# Patient Record
Sex: Female | Born: 1965 | Race: White | Hispanic: No
Health system: Southern US, Community
[De-identification: ages and names within clinical notes are randomized; demographics above are authoritative.]

---

## 2012-03-18 HISTORY — PX: BREAST BIOPSY: SHX20

## 2017-02-26 DIAGNOSIS — Z0000 Encounter for general adult medical examination without abnormal findings: Secondary | ICD-10-CM | POA: Diagnosis not present

## 2017-05-14 DIAGNOSIS — R739 Hyperglycemia, unspecified: Secondary | ICD-10-CM | POA: Diagnosis not present

## 2017-05-14 DIAGNOSIS — Z0000 Encounter for general adult medical examination without abnormal findings: Secondary | ICD-10-CM | POA: Diagnosis not present

## 2017-05-14 DIAGNOSIS — Z23 Encounter for immunization: Secondary | ICD-10-CM | POA: Diagnosis not present

## 2017-09-17 DIAGNOSIS — R1013 Epigastric pain: Secondary | ICD-10-CM | POA: Diagnosis not present

## 2017-09-29 DIAGNOSIS — N390 Urinary tract infection, site not specified: Secondary | ICD-10-CM | POA: Diagnosis not present

## 2017-10-18 DIAGNOSIS — A048 Other specified bacterial intestinal infections: Secondary | ICD-10-CM | POA: Diagnosis not present

## 2017-11-11 DIAGNOSIS — R739 Hyperglycemia, unspecified: Secondary | ICD-10-CM | POA: Diagnosis not present

## 2018-02-15 DIAGNOSIS — Z23 Encounter for immunization: Secondary | ICD-10-CM | POA: Diagnosis not present

## 2018-02-16 ENCOUNTER — Other Ambulatory Visit: Payer: Self-pay | Admitting: Family Medicine

## 2018-02-16 DIAGNOSIS — N644 Mastodynia: Secondary | ICD-10-CM

## 2018-02-16 DIAGNOSIS — Z1239 Encounter for other screening for malignant neoplasm of breast: Secondary | ICD-10-CM

## 2018-02-25 ENCOUNTER — Ambulatory Visit
Admission: RE | Admit: 2018-02-25 | Discharge: 2018-02-25 | Disposition: A | Discharge status: Home / Self Care | Payer: 59 | Source: Ambulatory Visit | Attending: Family Medicine | Admitting: Family Medicine

## 2018-02-25 ENCOUNTER — Encounter: Payer: Self-pay | Admitting: Radiology

## 2018-02-25 DIAGNOSIS — N6314 Unspecified lump in the right breast, lower inner quadrant: Secondary | ICD-10-CM | POA: Diagnosis not present

## 2018-02-25 DIAGNOSIS — Z1239 Encounter for other screening for malignant neoplasm of breast: Secondary | ICD-10-CM | POA: Diagnosis not present

## 2018-02-25 DIAGNOSIS — N644 Mastodynia: Secondary | ICD-10-CM

## 2018-02-25 DIAGNOSIS — N6313 Unspecified lump in the right breast, lower outer quadrant: Secondary | ICD-10-CM | POA: Diagnosis not present

## 2018-02-25 DIAGNOSIS — R928 Other abnormal and inconclusive findings on diagnostic imaging of breast: Secondary | ICD-10-CM | POA: Diagnosis not present

## 2018-03-01 DIAGNOSIS — Z01419 Encounter for gynecological examination (general) (routine) without abnormal findings: Secondary | ICD-10-CM | POA: Diagnosis not present

## 2018-05-16 DIAGNOSIS — R7309 Other abnormal glucose: Secondary | ICD-10-CM | POA: Diagnosis not present

## 2018-05-16 DIAGNOSIS — Z13220 Encounter for screening for lipoid disorders: Secondary | ICD-10-CM | POA: Diagnosis not present

## 2018-05-16 DIAGNOSIS — Z0000 Encounter for general adult medical examination without abnormal findings: Secondary | ICD-10-CM | POA: Diagnosis not present

## 2018-05-30 DIAGNOSIS — Z1211 Encounter for screening for malignant neoplasm of colon: Secondary | ICD-10-CM | POA: Diagnosis not present

## 2018-05-30 DIAGNOSIS — Z01818 Encounter for other preprocedural examination: Secondary | ICD-10-CM | POA: Diagnosis not present

## 2018-05-30 DIAGNOSIS — Z8371 Family history of colonic polyps: Secondary | ICD-10-CM | POA: Diagnosis not present

## 2018-07-26 DIAGNOSIS — H6122 Impacted cerumen, left ear: Secondary | ICD-10-CM | POA: Diagnosis not present

## 2018-07-26 DIAGNOSIS — H60502 Unspecified acute noninfective otitis externa, left ear: Secondary | ICD-10-CM | POA: Diagnosis not present

## 2019-07-16 ENCOUNTER — Other Ambulatory Visit: Payer: Self-pay

## 2019-07-16 ENCOUNTER — Ambulatory Visit: Payer: Self-pay | Attending: Internal Medicine

## 2019-07-16 DIAGNOSIS — Z23 Encounter for immunization: Secondary | ICD-10-CM

## 2019-07-16 NOTE — Progress Notes (Signed)
   Covid-19 Vaccination Clinic  Name:  Carrie Martin    MRN: [id number] DOB: 1966/02/11  07/16/2019  [name] was observed post Covid-19 immunization for 15 minutes without incident. She was provided with Vaccine Information Sheet and instruction to access the V-Safe system.   [name] was instructed to call 911 with any severe reactions post vaccine: Marland Kitchen Difficulty breathing  . Swelling of face and throat  . A fast heartbeat  . A bad rash all over body  . Dizziness and weakness   Immunizations Administered    Name Date Dose VIS Date Route   Pfizer COVID-19 Vaccine 07/16/2019  3:48 PM 0.3 mL 04/07/2019 Intramuscular   Manufacturer: [employer], [employer]   Lot: [id number]   NDC: [address]-4

## 2019-08-09 IMAGING — US US BREAST*R* LIMITED INC AXILLA
1 series · 1 of 1 positions shown · non-contrast
Comparison: Previous exam(s).

CLINICAL DATA: 52-year-old patient has recently had some far
lateral right breast/mid axillary chest wall tenderness. This has
improved and then resolved recently. She believes this improvement
may be related to resuming her asthma inhaler regularly. On clinical
physical exam a palpable lump was felt in the 6 o'clock region of
the right breast. The patient had not noticed it previously, but can
feel the area that her doctor pointed out to her.

EXAM:
DIGITAL DIAGNOSTIC BILATERAL MAMMOGRAM WITH CAD AND TOMO
ULTRASOUND RIGHT BREAST

[Series 1: us breast*right* limited inc axilla · 0.07mm/px · 1 of 1 slices shown]
[im 1/1]
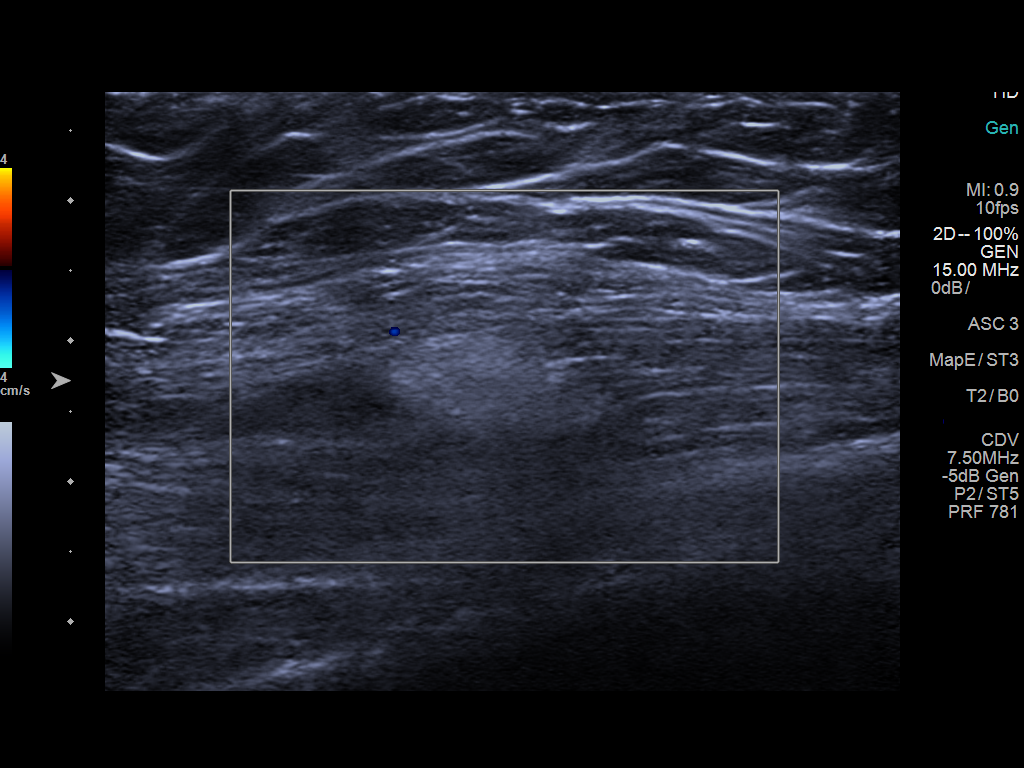

[1 of 1 positions shown; findings below may reference images not displayed]

ACR Breast Density Category b: There are scattered areas of
fibroglandular density.
FINDINGS: Metallic skin marker was placed in the 6 o'clock region in the area
of palpable concern. No mass, architectural distortion, or
suspicious microcalcification is identified in either breast to
suggest malignancy.

Mammographic images were processed with CAD.

On physical exam, the lateral right chest wall is soft to palpation.
The patient is nontender at this time. I do palpate a smooth oblong
and transversely oriented ridgelike area in the 6 o'clock position
of the right breast just proximal to the inframammary fold.

Targeted ultrasound is performed, showing normal subcutaneous fat is
seen in the right lateral chest wall. No suspicious findings are
identified.

Direct ultrasound over the palpable lump in the 6 o'clock position
of the right breast just proximal to the inframammary fold shows
normal fat lobules. No solid or cystic mass or abnormal shadowing is
identified in the region of the palpable lump.
IMPRESSION: No evidence of malignancy in the right breast. Smooth palpable lump
in the 6 o'clock region of the right breast near the inframammary
fold. Normal tissue is seen on mammography and ultrasound.

RECOMMENDATION:
Screening mammogram in one year.(Code:[id number])

I have discussed the findings and recommendations with the patient.
Results were also provided in writing at the conclusion of the
visit. If applicable, a reminder letter will be sent to the patient
regarding the next appointment.

BI-RADS CATEGORY  1: Negative.

## 2019-08-13 ENCOUNTER — Ambulatory Visit: Payer: Self-pay | Attending: Internal Medicine

## 2019-08-13 DIAGNOSIS — Z23 Encounter for immunization: Secondary | ICD-10-CM

## 2019-08-13 NOTE — Progress Notes (Signed)
   Covid-19 Vaccination Clinic  Name:  Carrie Martin    MRN: [id number] DOB: 20-Sep-1965  08/13/2019  Carrie Martin was observed post Covid-19 immunization for 15 minutes without incident. She was provided with Vaccine Information Sheet and instruction to access the V-Safe system.   Carrie Martin was instructed to call 911 with any severe reactions post vaccine: Marland Kitchen Difficulty breathing  . Swelling of face and throat  . A fast heartbeat  . A bad rash all over body  . Dizziness and weakness   Immunizations Administered    Name Date Dose VIS Date Route   Pfizer COVID-19 Vaccine 08/13/2019  3:12 PM 0.3 mL 04/07/2019 Intramuscular   Manufacturer: [employer], [employer]   Lot: [id number]   NDC: [address]-7

## 2020-07-19 IMAGING — MG MM DIGITAL DIAGNOSTIC BILAT W/ TOMO W/ CAD
6 of 12 series · 6 of 36 positions shown · non-contrast
Comparison: Previous exam(s).

CLINICAL DATA: 52-year-old patient has recently had some far
lateral right breast/mid axillary chest wall tenderness. This has
improved and then resolved recently. She believes this improvement
may be related to resuming her asthma inhaler regularly. On clinical
physical exam a palpable lump was felt in the 6 o'clock region of
the right breast. The patient had not noticed it previously, but can
feel the area that her doctor pointed out to her.

EXAM:
DIGITAL DIAGNOSTIC BILATERAL MAMMOGRAM WITH CAD AND TOMO
ULTRASOUND RIGHT BREAST

[L CC synth-2D]
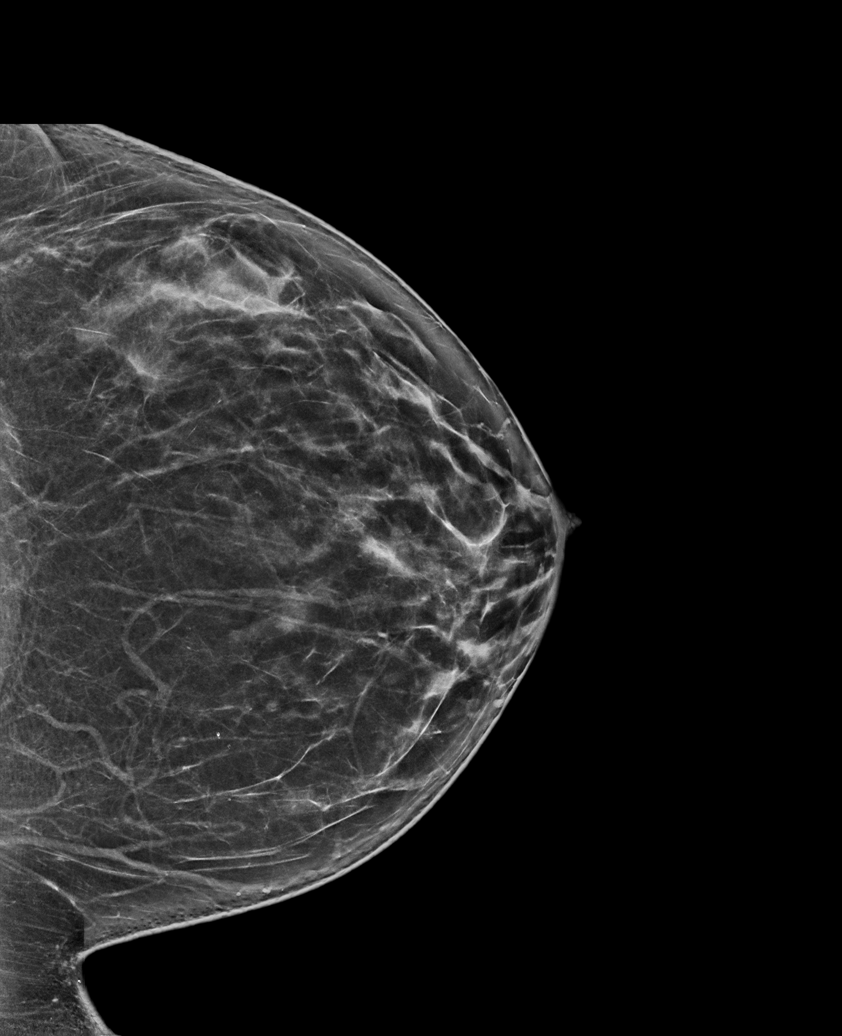

[R ML synth-2D]
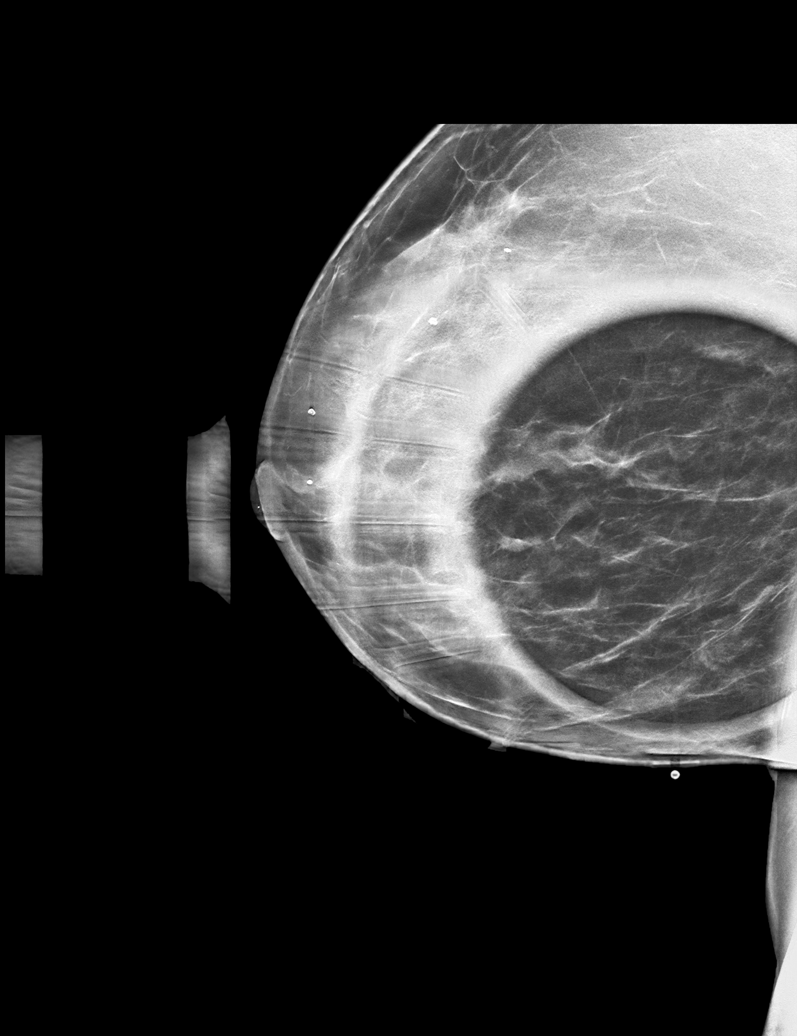

[R CC synth-2D (1 of 2)]
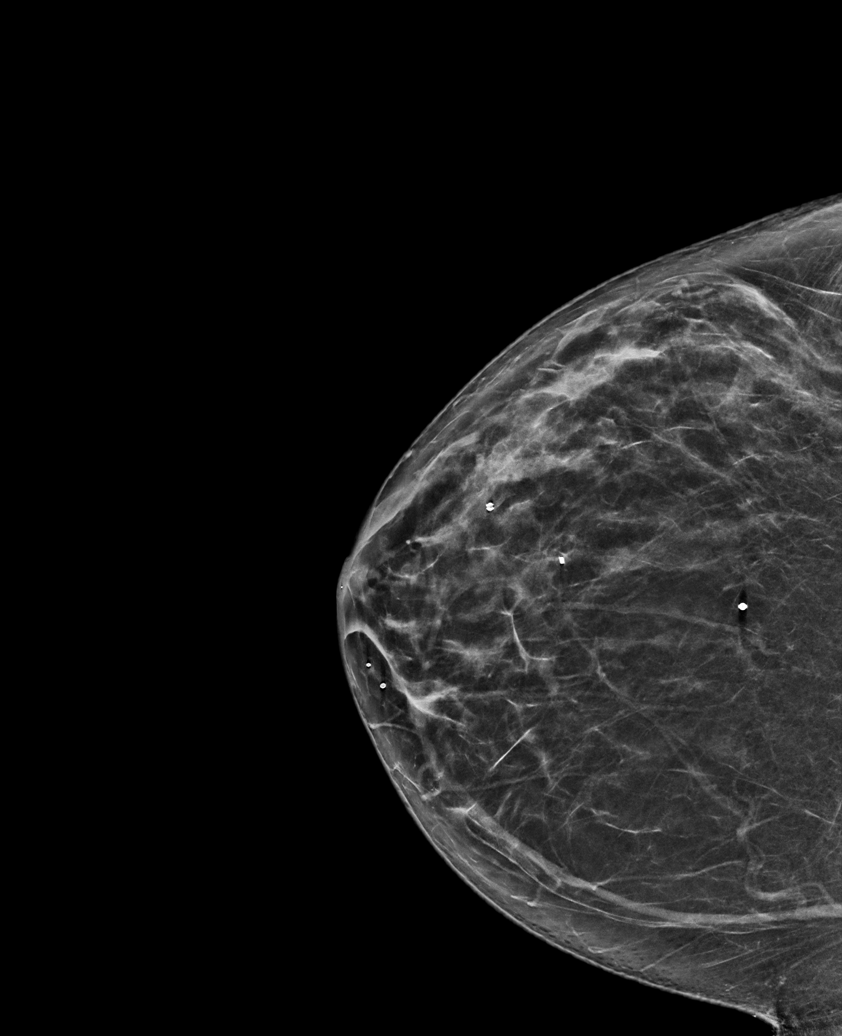

[R CC synth-2D (2 of 2)]
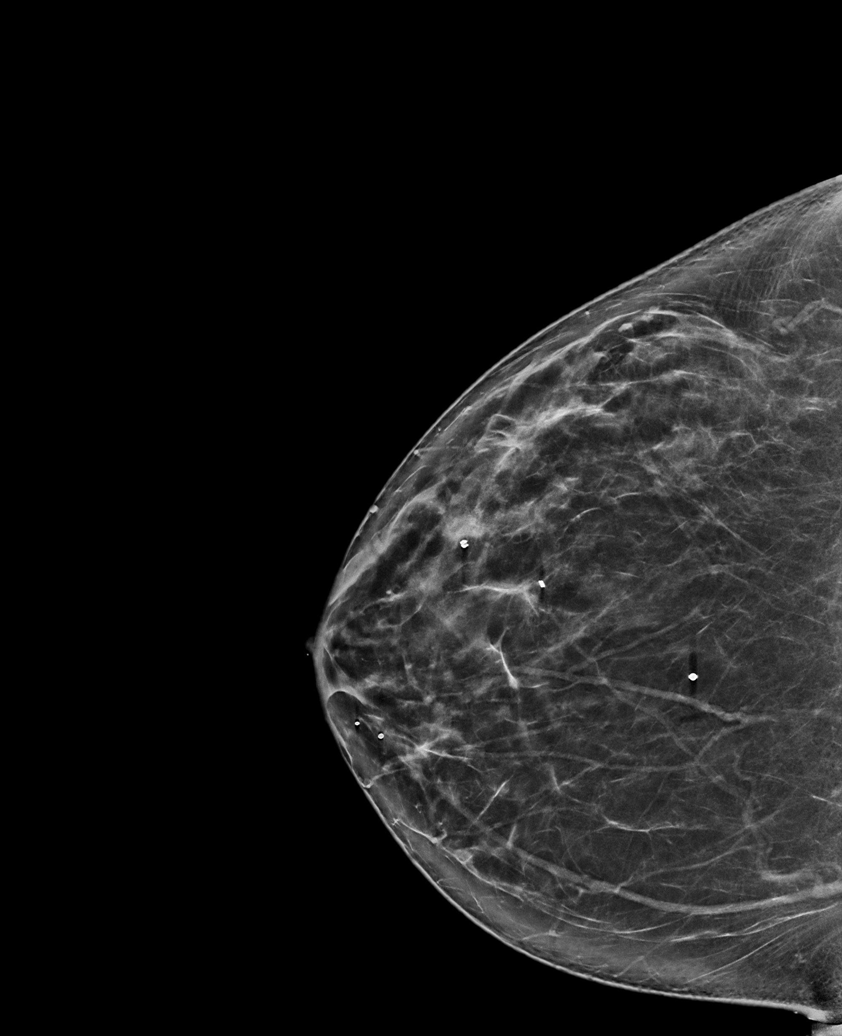

[L MLO synth-2D]
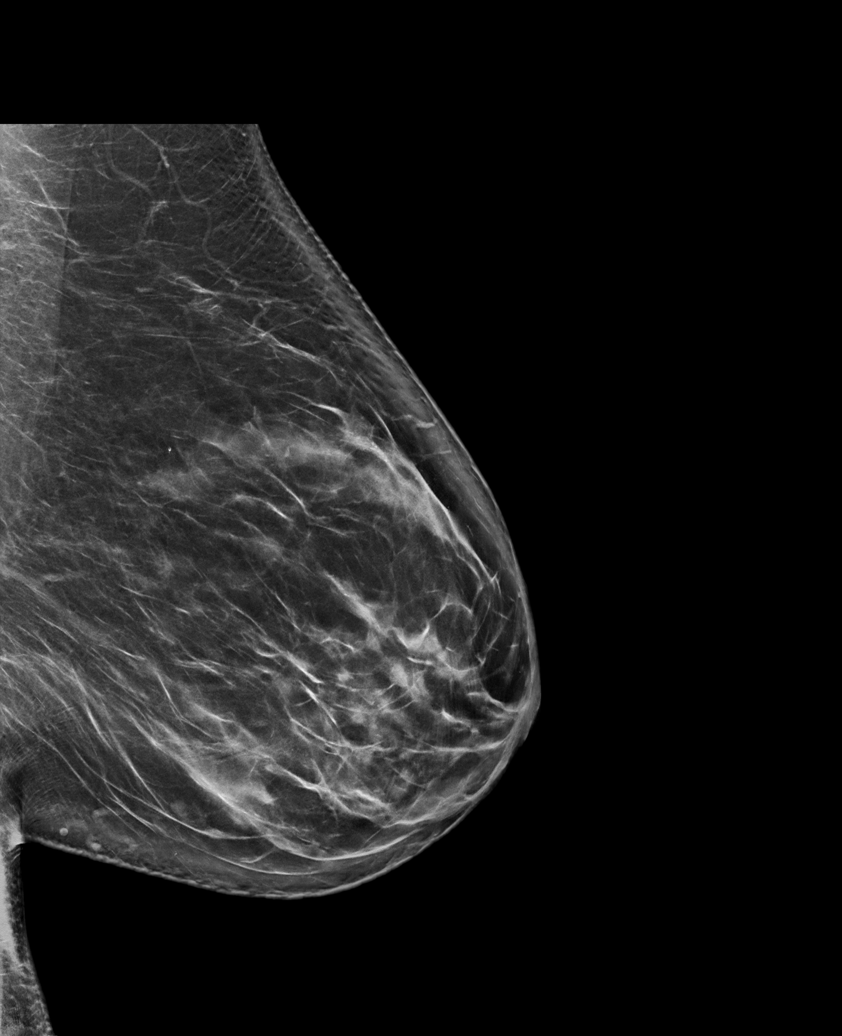

[R MLO synth-2D]
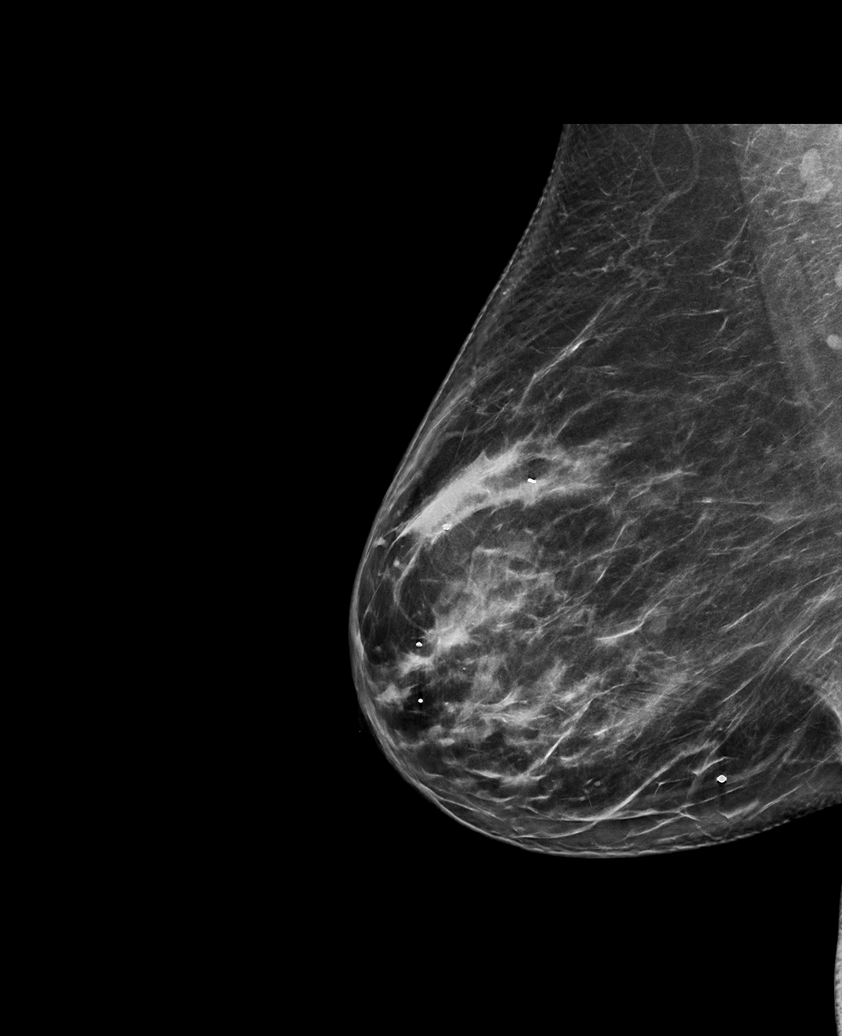

[6 of 36 positions shown; findings below may reference images not displayed]

ACR Breast Density Category b: There are scattered areas of
fibroglandular density.
FINDINGS: Metallic skin marker was placed in the 6 o'clock region in the area
of palpable concern. No mass, architectural distortion, or
suspicious microcalcification is identified in either breast to
suggest malignancy.

Mammographic images were processed with CAD.

On physical exam, the lateral right chest wall is soft to palpation.
The patient is nontender at this time. I do palpate a smooth oblong
and transversely oriented ridgelike area in the 6 o'clock position
of the right breast just proximal to the inframammary fold.

Targeted ultrasound is performed, showing normal subcutaneous fat is
seen in the right lateral chest wall. No suspicious findings are
identified.

Direct ultrasound over the palpable lump in the 6 o'clock position
of the right breast just proximal to the inframammary fold shows
normal fat lobules. No solid or cystic mass or abnormal shadowing is
identified in the region of the palpable lump.
IMPRESSION: No evidence of malignancy in the right breast. Smooth palpable lump
in the 6 o'clock region of the right breast near the inframammary
fold. Normal tissue is seen on mammography and ultrasound.

RECOMMENDATION:
Screening mammogram in one year.(Code:[id number])

I have discussed the findings and recommendations with the patient.
Results were also provided in writing at the conclusion of the
visit. If applicable, a reminder letter will be sent to the patient
regarding the next appointment.

BI-RADS CATEGORY  1: Negative.
